# Patient Record
Sex: Female | Born: 1980 | Race: White | Hispanic: Yes | Marital: Single | Smoking: Never smoker
Health system: Southern US, Community
[De-identification: ages and names within clinical notes are randomized; demographics above are authoritative.]

## PROBLEM LIST (undated history)

## (undated) DIAGNOSIS — F32A Depression, unspecified: Secondary | ICD-10-CM

## (undated) DIAGNOSIS — F329 Major depressive disorder, single episode, unspecified: Secondary | ICD-10-CM

## (undated) DIAGNOSIS — F419 Anxiety disorder, unspecified: Secondary | ICD-10-CM

---

## 2003-06-09 ENCOUNTER — Inpatient Hospital Stay (HOSPITAL_COMMUNITY): Admission: RE | Admit: 2003-06-09 | Discharge: 2003-06-12 | Payer: Self-pay | Admitting: Obstetrics

## 2004-11-14 ENCOUNTER — Inpatient Hospital Stay (HOSPITAL_COMMUNITY): Admission: EM | Admit: 2004-11-14 | Discharge: 2004-11-16 | Payer: Self-pay | Admitting: Emergency Medicine

## 2004-11-14 ENCOUNTER — Ambulatory Visit: Payer: Self-pay | Admitting: Internal Medicine

## 2005-07-19 ENCOUNTER — Ambulatory Visit: Payer: Self-pay | Admitting: Obstetrics and Gynecology

## 2005-07-24 ENCOUNTER — Ambulatory Visit (HOSPITAL_COMMUNITY): Admission: RE | Admit: 2005-07-24 | Discharge: 2005-07-24 | Payer: Self-pay | Admitting: *Deleted

## 2005-08-09 ENCOUNTER — Ambulatory Visit: Payer: Self-pay | Admitting: Obstetrics and Gynecology

## 2013-01-17 ENCOUNTER — Encounter (HOSPITAL_COMMUNITY): Payer: Self-pay

## 2013-01-17 ENCOUNTER — Other Ambulatory Visit (HOSPITAL_COMMUNITY): Payer: Self-pay | Admitting: *Deleted

## 2013-01-17 DIAGNOSIS — N6310 Unspecified lump in the right breast, unspecified quadrant: Secondary | ICD-10-CM

## 2013-01-21 ENCOUNTER — Encounter (INDEPENDENT_AMBULATORY_CARE_PROVIDER_SITE_OTHER): Payer: Self-pay

## 2013-01-21 ENCOUNTER — Ambulatory Visit (HOSPITAL_COMMUNITY)
Admission: RE | Admit: 2013-01-21 | Discharge: 2013-01-21 | Disposition: A | Discharge status: Home / Self Care | Payer: No Typology Code available for payment source | Source: Ambulatory Visit | Attending: Obstetrics and Gynecology | Admitting: Obstetrics and Gynecology

## 2013-01-21 ENCOUNTER — Encounter (HOSPITAL_COMMUNITY): Payer: Self-pay

## 2013-01-21 VITALS — BP 116/68 | Temp 98.0°F | Ht 62.0 in | Wt 143.4 lb

## 2013-01-21 DIAGNOSIS — N6315 Unspecified lump in the right breast, overlapping quadrants: Secondary | ICD-10-CM | POA: Insufficient documentation

## 2013-01-21 DIAGNOSIS — Z1239 Encounter for other screening for malignant neoplasm of breast: Secondary | ICD-10-CM

## 2013-01-21 HISTORY — DX: Anxiety disorder, unspecified: F41.9

## 2013-01-21 HISTORY — DX: Major depressive disorder, single episode, unspecified: F32.9

## 2013-01-21 HISTORY — DX: Depression, unspecified: F32.A

## 2013-01-21 NOTE — Patient Instructions (Signed)
Taught Claudia Mccoy how to perform BSE and gave educational materials to take home. Claudia Mccoy did not need a Pap smear today due to last Pap smear was 05/24/2011. Let her know BCCCP will cover Pap smears every 3 years unless has a history of abnormal Pap smears. Referred Claudia Mccoy to the Breast Center of [facility] for diagnostic mammogram and right breast ultrasound. Appointment scheduled for Thursday, February 06, 2013 at 1315. Claudia Mccoy aware of appointment and will be there.  [name] verbalized understanding.  Larisha Vencill, [name], RN 2:56 PM

## 2013-01-21 NOTE — Progress Notes (Signed)
Complaints of left breast lump x 1.5 years that patient states is painful at times with the pain increasing over the the last 5 months. Patient states the pain is a burning like feeling that she rates at a 5 out of 10.  Pap Smear:    Pap smear not completed today. Last Pap smear was 05/24/2011 at the [facility] Department and normal. Per patient no history of an abnormal Pap smear. Last Pap smear result is scanned in EPIC under media.  Physical exam: Breasts Breasts symmetrical. No skin abnormalities bilateral breasts. No nipple retraction bilateral breasts. No nipple discharge bilateral breasts. No lymphadenopathy. No lumps palpated right breast. Palpated a lump within the left breast at 3 o'clock next to the areola. No complaints of pain or tenderness on exam. Referred patient to the [facility] Center of [facility] for diagnostic mammogram and right breast ultrasound. Appointment scheduled for Thursday, February 06, 2013 at 1315.  Pelvic/Bimanual No Pap smear completed today since last Pap smear was 05/24/2011. Pap smear not indicated per BCCCP guidelines.

## 2013-01-23 ENCOUNTER — Other Ambulatory Visit (HOSPITAL_COMMUNITY): Payer: Self-pay | Admitting: *Deleted

## 2013-01-23 DIAGNOSIS — N6320 Unspecified lump in the left breast, unspecified quadrant: Secondary | ICD-10-CM

## 2013-01-30 ENCOUNTER — Ambulatory Visit: Payer: Self-pay

## 2013-02-06 ENCOUNTER — Ambulatory Visit
Admission: RE | Admit: 2013-02-06 | Discharge: 2013-02-06 | Disposition: A | Discharge status: Home / Self Care | Payer: No Typology Code available for payment source | Source: Ambulatory Visit | Attending: Obstetrics and Gynecology | Admitting: Obstetrics and Gynecology

## 2013-02-06 DIAGNOSIS — N6310 Unspecified lump in the right breast, unspecified quadrant: Secondary | ICD-10-CM

## 2013-07-04 ENCOUNTER — Other Ambulatory Visit: Payer: Self-pay | Admitting: Obstetrics and Gynecology

## 2013-07-04 DIAGNOSIS — R921 Mammographic calcification found on diagnostic imaging of breast: Secondary | ICD-10-CM

## 2013-08-07 ENCOUNTER — Ambulatory Visit
Admission: RE | Admit: 2013-08-07 | Discharge: 2013-08-07 | Disposition: A | Discharge status: Home / Self Care | Payer: No Typology Code available for payment source | Source: Ambulatory Visit | Attending: Obstetrics and Gynecology | Admitting: Obstetrics and Gynecology

## 2013-08-07 ENCOUNTER — Encounter (INDEPENDENT_AMBULATORY_CARE_PROVIDER_SITE_OTHER): Payer: Self-pay

## 2013-08-07 DIAGNOSIS — R921 Mammographic calcification found on diagnostic imaging of breast: Secondary | ICD-10-CM

## 2013-08-18 ENCOUNTER — Ambulatory Visit: Payer: Self-pay | Admitting: Internal Medicine

## 2014-01-02 ENCOUNTER — Ambulatory Visit: Payer: No Typology Code available for payment source

## 2014-01-19 ENCOUNTER — Encounter (HOSPITAL_COMMUNITY): Payer: Self-pay

## 2017-02-21 ENCOUNTER — Encounter (HOSPITAL_COMMUNITY): Payer: Self-pay

## 2017-08-01 ENCOUNTER — Encounter (HOSPITAL_COMMUNITY): Payer: Self-pay

## 2018-03-25 ENCOUNTER — Other Ambulatory Visit (HOSPITAL_COMMUNITY): Payer: Self-pay | Admitting: *Deleted

## 2018-03-25 DIAGNOSIS — N6320 Unspecified lump in the left breast, unspecified quadrant: Secondary | ICD-10-CM

## 2018-03-28 ENCOUNTER — Other Ambulatory Visit (HOSPITAL_COMMUNITY): Payer: Self-pay | Admitting: *Deleted

## 2018-03-28 ENCOUNTER — Ambulatory Visit
Admission: RE | Admit: 2018-03-28 | Discharge: 2018-03-28 | Disposition: A | Discharge status: Home / Self Care | Payer: No Typology Code available for payment source | Source: Ambulatory Visit | Attending: Obstetrics and Gynecology | Admitting: Obstetrics and Gynecology

## 2018-03-28 ENCOUNTER — Ambulatory Visit
Admission: RE | Admit: 2018-03-28 | Discharge: 2018-03-28 | Disposition: A | Discharge status: Home / Self Care | Payer: Self-pay | Source: Ambulatory Visit | Attending: Obstetrics and Gynecology | Admitting: Obstetrics and Gynecology

## 2018-03-28 ENCOUNTER — Ambulatory Visit (HOSPITAL_COMMUNITY)
Admission: RE | Admit: 2018-03-28 | Discharge: 2018-03-28 | Disposition: A | Discharge status: Home / Self Care | Payer: Self-pay | Source: Ambulatory Visit | Attending: Obstetrics and Gynecology | Admitting: Obstetrics and Gynecology

## 2018-03-28 ENCOUNTER — Encounter (HOSPITAL_COMMUNITY): Payer: Self-pay

## 2018-03-28 VITALS — BP 118/76 | Wt 151.0 lb

## 2018-03-28 DIAGNOSIS — N63 Unspecified lump in unspecified breast: Secondary | ICD-10-CM | POA: Insufficient documentation

## 2018-03-28 DIAGNOSIS — N6452 Nipple discharge: Secondary | ICD-10-CM

## 2018-03-28 DIAGNOSIS — Z1239 Encounter for other screening for malignant neoplasm of breast: Secondary | ICD-10-CM

## 2018-03-28 DIAGNOSIS — N6311 Unspecified lump in the right breast, upper outer quadrant: Secondary | ICD-10-CM

## 2018-03-28 DIAGNOSIS — N6310 Unspecified lump in the right breast, unspecified quadrant: Secondary | ICD-10-CM

## 2018-03-28 DIAGNOSIS — N644 Mastodynia: Secondary | ICD-10-CM

## 2018-03-28 DIAGNOSIS — N6320 Unspecified lump in the left breast, unspecified quadrant: Secondary | ICD-10-CM

## 2018-03-28 NOTE — Addendum Note (Signed)
Encounter addended by: Lynnell Dike, LPN on: 04/24/9561 8:27 AM  Actions taken: Order list changed

## 2018-03-28 NOTE — Addendum Note (Signed)
Encounter addended by: Priscille Heidelberg, RN on: 03/28/2018 9:27 AM  Actions taken: Clinical Note Signed

## 2018-03-28 NOTE — Patient Instructions (Signed)
Explained breast self awareness with [facility]. Patient did not need a Pap smear today due to last Pap smear was 08/01/2017. Let her know BCCCP will cover Pap smears every 3 years unless has a history of abnormal Pap smears. Referred patient to the Breast Center of [facility] for a diagnostic mammogram and right breast ultrasound. Appointment scheduled for Thursday, March 28, 2018 at 1320. Patient aware of appointment and will be there. Let patient know will follow up with her within the next couple weeks with results of breast discharge by phone. Claudia Mccoy verbalized understanding.  Stpehanie Montroy, [name], RN 8:18 AM

## 2018-03-28 NOTE — Progress Notes (Addendum)
Complaints of a right breast lump, pain, and a clear/yellowish colored discharge since August 2019. Patient states the pain comes and goes. Patient rates the pain at a 7-8 out of 10. Patient stated the discharge is spontaneous at times and sometimes expresses due to it relieves the pain.  Pap Smear: Pap smear not completed today. Last Pap smear was 08/01/2017 at the [facility] Department and normal. Per patient has no history of an abnormal Pap smear. Last Pap smear result is in Epic.  Physical exam: Breasts Breasts symmetrical. No skin abnormalities bilateral breasts. No nipple retraction bilateral breasts. No nipple discharge left breast. Expressed a clear colored nipple discharge from the right breast. Sample of discharge sent to Cytology for evaluation. No lymphadenopathy. No lumps palpated left breast. Palpated a pea sized lump within the right breast at 10 o'clock 6 cm from the nipple. Complaints of pain when palpated lump. Referred patient to the [facility] Center of [facility] for a diagnostic mammogram and right breast ultrasound. Appointment scheduled for Thursday, March 28, 2018 at 1320.        Pelvic/Bimanual No Pap smear completed today since last Pap smear was 08/01/2017. Pap smear not indicated per BCCCP guidelines.   Smoking History: Patient has never smoked.  Patient Navigation: Patient education provided. Access to services provided for patient through [facility] program. Spanish interpreter provided.   Breast and Cervical Cancer Risk Assessment: Patient has a family history of a paternal aunt having breast cancer. Patient has no known genetic mutations or history of radiation treatment to the chest before age 26. Patient has no history of cervical dysplasia, immunocompromised, or DES exposure in-utero.  Risk Assessment    Risk Scores      03/28/2018   Last edited by: Lynnell Dike, LPN   5-year risk: 0.3 %   Lifetime risk: 6.4 %         Used Spanish interpreter  Natale Lay from [place].

## 2018-04-01 ENCOUNTER — Encounter (HOSPITAL_COMMUNITY): Payer: Self-pay | Admitting: *Deleted

## 2019-02-28 ENCOUNTER — Other Ambulatory Visit (HOSPITAL_COMMUNITY): Payer: Self-pay | Admitting: *Deleted

## 2019-02-28 DIAGNOSIS — N644 Mastodynia: Secondary | ICD-10-CM

## 2019-03-27 ENCOUNTER — Ambulatory Visit (HOSPITAL_COMMUNITY): Payer: No Typology Code available for payment source

## 2019-04-01 ENCOUNTER — Encounter (HOSPITAL_COMMUNITY): Payer: Self-pay

## 2019-04-01 ENCOUNTER — Ambulatory Visit: Payer: No Typology Code available for payment source

## 2019-04-01 ENCOUNTER — Other Ambulatory Visit: Payer: Self-pay

## 2019-04-01 ENCOUNTER — Ambulatory Visit (HOSPITAL_COMMUNITY)
Admission: RE | Admit: 2019-04-01 | Discharge: 2019-04-01 | Disposition: A | Discharge status: Home / Self Care | Payer: No Typology Code available for payment source | Source: Ambulatory Visit | Attending: Obstetrics and Gynecology | Admitting: Obstetrics and Gynecology

## 2019-04-01 ENCOUNTER — Ambulatory Visit
Admission: RE | Admit: 2019-04-01 | Discharge: 2019-04-01 | Disposition: A | Discharge status: Home / Self Care | Payer: No Typology Code available for payment source | Source: Ambulatory Visit | Attending: Obstetrics and Gynecology | Admitting: Obstetrics and Gynecology

## 2019-04-01 DIAGNOSIS — N644 Mastodynia: Secondary | ICD-10-CM

## 2019-04-01 DIAGNOSIS — Z1239 Encounter for other screening for malignant neoplasm of breast: Secondary | ICD-10-CM | POA: Insufficient documentation

## 2019-04-01 NOTE — Progress Notes (Signed)
Complaints of right outer breast and nipple pain x one month that comes and goes. Patient rates the pain at a 8 out of 10.  Pap Smear: Pap smear not completed today. Last Pap smear was 08/01/2017 at the [facility] Department and normal. Per patient has no history of an abnormal Pap smear. Last Pap smear result is in Epic.  Physical exam: Breasts Breasts symmetrical. No skin abnormalities bilateral breasts. No nipple retraction bilateral breasts. No nipple discharge bilateral breasts. No lymphadenopathy. No lumps palpated bilateral breasts. Complaints of right outer breast and axillary pain on exam. Referred patient to the [facility] Center of [facility] for a diagnostic mammogram. Appointment scheduled for Tuesday, April 01, 2019 at 0940.        Pelvic/Bimanual No Pap smear completed today since last Pap smear was 08/01/2017. Pap smear not indicated per BCCCP guidelines.   Smoking History: Patient has never smoked.  Patient Navigation: Patient education provided. Access to services provided for patient through [facility] program. Spanish interpreter provided.   Breast and Cervical Cancer Risk Assessment: Patient has a family history of a paternal aunt having breast cancer. Patient has no known genetic mutations or history of radiation treatment to the chest before age 84. Patient has no history of cervical dysplasia, immunocompromised, or DES exposure in-utero.  Risk Assessment    Risk Scores      04/01/2019 03/28/2018   Last edited by: Priscille Heidelberg, RN Lynnell Dike, LPN   5-year risk: 0.3 % 0.3 %   Lifetime risk: 6.4 % 6.4 %         Used Spanish interpreter Natale Lay from [place].

## 2019-04-01 NOTE — Patient Instructions (Signed)
Explained breast self awareness with [facility]. Patient did not need a Pap smear today due to last Pap smear was 08/01/2017. Let her know BCCCP will cover Pap smears every 3 years unless has a history of abnormal Pap smears. Referred patient to the Breast Center of [facility] for a diagnostic mammogram. Appointment scheduled for Tuesday, April 01, 2019 at 0940. Patient aware of appointment and will be there. Claudia Mccoy verbalized understanding.  Ezri Landers, [name], RN 8:29 AM

## 2020-02-27 ENCOUNTER — Other Ambulatory Visit: Payer: Self-pay | Admitting: Obstetrics and Gynecology

## 2020-02-27 DIAGNOSIS — Z1231 Encounter for screening mammogram for malignant neoplasm of breast: Secondary | ICD-10-CM

## 2020-04-06 ENCOUNTER — Ambulatory Visit: Payer: No Typology Code available for payment source

## 2020-04-22 ENCOUNTER — Ambulatory Visit: Payer: Self-pay | Admitting: *Deleted

## 2020-04-22 ENCOUNTER — Ambulatory Visit: Payer: No Typology Code available for payment source

## 2020-04-22 ENCOUNTER — Other Ambulatory Visit: Payer: Self-pay

## 2020-04-22 VITALS — BP 122/74 | Wt 156.8 lb

## 2020-04-22 DIAGNOSIS — Z1239 Encounter for other screening for malignant neoplasm of breast: Secondary | ICD-10-CM

## 2020-04-22 DIAGNOSIS — N644 Mastodynia: Secondary | ICD-10-CM

## 2020-04-22 NOTE — Patient Instructions (Signed)
Explained breast self awareness with [facility]. Patient did not need a Pap smear today due to last Pap smear was 04/16/2020 per patient. Let her know BCCCP will cover Pap smears every 3 years unless has a history of abnormal Pap smears. Referred patient to the Breast Center of [facility] for a diagnostic mammogram. Appointment scheduled Tuesday, April 27, 2020 at 0910. Patient aware of appointment and will be there. Claudia Mccoy verbalized understanding.  [name], [name], RN 3:15 PM

## 2020-04-22 NOTE — Progress Notes (Signed)
Ms. Claudia Mccoy is a 40 y.o. female who presents to [facility] clinic today with complaint of right outer breast and axillary pain x one year after last mammogram. Patient stated the pain comes and goes and lasted 15 days last month. Patient rates the pain at a 8 out of 10.    Pap Smear: Pap smear not completed today. Last Pap smear was 04/16/2020 at the [facility] Department clinic and was normal per patient. Per patient has no history of an abnormal Pap smear. Last Pap smear result is not available in Epic.   Physical exam: Breasts Breasts symmetrical. No skin abnormalities bilateral breasts. No nipple retraction bilateral breasts. No nipple discharge bilateral breasts. No lymphadenopathy. No lumps palpated bilateral breasts. Complaints of bilateral outer breast tenderness that is greater within the right breast.       Pelvic/Bimanual Pap is not indicated today per BCCCP guidelines.   Smoking History: Patient has never smoked.   Patient Navigation: Patient education provided. Access to services provided for patient through [place] program. Spanish interpreter Natale Lay from [facility] provided.   Breast and Cervical Cancer Risk Assessment: Patient has family history of a paternal aunt having breast cancer. Patient has no known genetic mutations or history of radiation treatment to the chest before age 21. Patient does not have history of cervical dysplasia, immunocompromised, or DES exposure in-utero.  Risk Assessment    Risk Scores      04/22/2020 04/01/2019   Last edited by: Narda Rutherford, LPN Chizaram Latino, Carlye Grippe, RN   5-year risk: 0.4 % 0.3 %   Lifetime risk: 6.3 % 6.4 %          A: BCCCP exam without pap smear Complaint of right outer breast and axillary pain.  P: Referred patient to the [facility] Center of [facility] for a diagnostic mammogram. Appointment scheduled Tuesday, April 27, 2020 at 0910.  Priscille Heidelberg, RN 04/22/2020 3:15 PM

## 2020-04-27 ENCOUNTER — Other Ambulatory Visit: Payer: Self-pay

## 2020-04-27 ENCOUNTER — Ambulatory Visit
Admission: RE | Admit: 2020-04-27 | Discharge: 2020-04-27 | Disposition: A | Discharge status: Home / Self Care | Payer: No Typology Code available for payment source | Source: Ambulatory Visit | Attending: Obstetrics and Gynecology | Admitting: Obstetrics and Gynecology

## 2020-04-27 ENCOUNTER — Ambulatory Visit: Payer: No Typology Code available for payment source

## 2020-04-27 DIAGNOSIS — N644 Mastodynia: Secondary | ICD-10-CM

## 2021-03-25 ENCOUNTER — Other Ambulatory Visit: Payer: Self-pay

## 2021-03-25 DIAGNOSIS — Z1231 Encounter for screening mammogram for malignant neoplasm of breast: Secondary | ICD-10-CM

## 2021-05-12 ENCOUNTER — Ambulatory Visit: Payer: Self-pay

## 2021-06-16 ENCOUNTER — Ambulatory Visit: Payer: Self-pay | Admitting: *Deleted

## 2021-06-16 ENCOUNTER — Ambulatory Visit
Admission: RE | Admit: 2021-06-16 | Discharge: 2021-06-16 | Disposition: A | Discharge status: Home / Self Care | Payer: No Typology Code available for payment source | Source: Ambulatory Visit | Attending: Obstetrics and Gynecology | Admitting: Obstetrics and Gynecology

## 2021-06-16 VITALS — BP 120/74 | Wt 162.9 lb

## 2021-06-16 DIAGNOSIS — Z1239 Encounter for other screening for malignant neoplasm of breast: Secondary | ICD-10-CM

## 2021-06-16 DIAGNOSIS — Z1231 Encounter for screening mammogram for malignant neoplasm of breast: Secondary | ICD-10-CM

## 2021-06-16 NOTE — Progress Notes (Signed)
Ms. Claudia Mccoy is a 41 y.o. female who presents to [facility] clinic today with no complaints.  ?  ?Pap Smear: Pap smear not completed today. Last Pap smear was 04/16/2020 at the [facility] Department clinic and was normal per patient. Per patient has no history of an abnormal Pap smear. Last Pap smear result is not available in Epic. ?  ?Physical exam: ?Breasts ?Breasts symmetrical. No skin abnormalities bilateral breasts. No nipple retraction bilateral breasts. No nipple discharge bilateral breasts. No lymphadenopathy. No lumps palpated bilateral breasts. No complaints of pain or tenderness on exam. ? ?MS DIGITAL DIAG TOMO BILAT ? ?Result Date: 04/27/2020 ?CLINICAL DATA:  Nonfocal right breast pain, unchanged since January 2000. EXAM: DIGITAL DIAGNOSTIC BILATERAL MAMMOGRAM WITH TOMOSYNTHESIS AND CAD TECHNIQUE: Bilateral digital diagnostic mammography and breast tomosynthesis was performed. The images were evaluated with computer-aided detection. COMPARISON:  Previous exam(s). ACR Breast Density Category c: The breast tissue is heterogeneously dense, which may obscure small masses. FINDINGS: Bilateral benign calcifications are known. No suspicious masses, calcifications, or distortion identified. IMPRESSION: No mammographic evidence of malignancy. RECOMMENDATION: Treatment of the patient's symptoms should be based on clinical and physical exam given lack of imaging findings. Recommend annual screening mammography. I have discussed the findings and recommendations with the patient. If applicable, a reminder letter will be sent to the patient regarding the next appointment. BI-RADS CATEGORY  2: Benign. Electronically Signed   By: Gerome Sam III M.D   On: 04/27/2020 09:50  ? ?MS DIGITAL DIAG TOMO BILAT ? ?Result Date: 04/01/2019 ?CLINICAL DATA:  41 year old female presenting for evaluation of nonfocal breast pain in the right breast which predominantly involves the central, lower and upper outer  quadrants into her right axilla/shoulder, however it also sometimes involves the medial right breast. This pain has been present for 1 year. EXAM: DIGITAL DIAGNOSTIC BILATERAL MAMMOGRAM WITH CAD AND TOMO COMPARISON:  Previous exam(s). ACR Breast Density Category c: The breast tissue is heterogeneously dense, which may obscure small masses. FINDINGS: No suspicious calcifications, masses or areas of distortion are seen in the bilateral breasts. Mammographic images were processed with CAD. IMPRESSION: 1. There are no suspicious mammographic findings to explain the patient's nonfocal right breast pain. 2.  No mammographic evidence of malignancy in the bilateral breasts. RECOMMENDATION: 1. Clinical follow-up recommended for the right breast pain. Any further workup should be based on clinical grounds. 2.  screening mammogram in one year.(Code:SM-B-01Y) I have discussed the findings and recommendations with the patient. If applicable, a reminder letter will be sent to the patient regarding the next appointment. BI-RADS CATEGORY  1: Negative. Electronically Signed   By: Frederico Hamman M.D.   On: 04/01/2019 10:05  ? ?MS DIGITAL DIAG TOMO BILAT ? ?Result Date: 03/28/2018 ?CLINICAL DATA:  41 year old female with a palpable abnormality in the right breast. EXAM: DIGITAL DIAGNOSTIC BILATERAL MAMMOGRAM WITH CAD AND TOMO RIGHT BREAST ULTRASOUND COMPARISON:  Previous exam(s). ACR Breast Density Category c: The breast tissue is heterogeneously dense, which may obscure small masses. FINDINGS: No suspicious masses or calcifications are seen in either breast. Spot compression CC tomograms were performed over the palpable area of concern in the right breast with no definite abnormality seen. Mammographic images were processed with CAD. Physical examination at site of palpable concern in the upper-outer right breast reveals generalized thickening without a discrete underlying palpable mass. The patient states this area tends to remain  tender. Targeted ultrasound of the right breast was performed. No suspicious masses or abnormality seen, only  heterogeneous fibroglandular tissue identified. IMPRESSION: 1.  No findings of malignancy in either breast. 2. No abnormalities identified at site of palpable concern in the upper-outer right breast. RECOMMENDATION: 1. Recommend further management of the right breast palpable abnormality be based on clinical assessment. 2.  Screening mammogram in one year.(Code:SM-B-01Y) I have discussed the findings and recommendations with the patient. Results were also provided in writing at the conclusion of the visit. If applicable, a reminder letter will be sent to the patient regarding the next appointment. BI-RADS CATEGORY  1: Negative. Electronically Signed   By: Edwin Cap M.D.   On: 03/28/2018 14:04   ?     ?Pelvic/Bimanual ?Pap is not indicated today per BCCCP guidelines. ?  ?Smoking History: ?Patient has never smoked. ? ?Patient Navigation: ?Patient education provided. Access to services provided for patient through [place] program. Spanish interpreter Natale Lay from [facility] provided.  ?  ?Breast and Cervical Cancer Risk Assessment: ?Patient has family history of a paternal aunt having breast cancer. Patient has no known genetic mutations or history of radiation treatment to the chest before age 89. Patient does not have history of cervical dysplasia, immunocompromised, or DES exposure in-utero. ? ?Risk Assessment   ? ? Risk Scores   ? ?   06/16/2021 04/22/2020  ? Last edited by: Narda Rutherford, LPN [place], Fidel Levy, LPN  ? 5-year risk: 0.4 % 0.4 %  ? Lifetime risk: 6.3 % 6.3 %  ? ?  ?  ? ?  ? ? ?A: ?BCCCP exam without pap smear ?No complaints. ? ?P: ?Referred patient to the Breast Center of [facility] for a screening mammogram. Appointment scheduled Thursday, June 16, 2021 at 1430. ? ?Priscille Heidelberg, RN ?06/16/2021 1:13 PM   ?

## 2021-06-16 NOTE — Patient Instructions (Signed)
Explained breast self awareness with [facility]. Patient did not need a Pap smear today due to last Pap smear was 04/16/2020. Let her know BCCCP will cover Pap smears every 3 years unless has a history of abnormal Pap smears. Referred patient to the [place] for a screening mammogram. Appointment scheduled Thursday, June 16, 2021 at 1430. Patient aware of appointment and will be there. Let patient know the Breast Center will follow up with her within the next couple weeks with results of her mammogram by letter or phone. Claudia Mccoy verbalized understanding. ? ?[name], Arvil Chaco, RN ?1:13 PM ? ? ? ? ?

## 2021-10-15 IMAGING — MG DIGITAL DIAGNOSTIC BILAT W/ TOMO W/ CAD
8 series · 9 of 24 positions shown · non-contrast
Comparison: Previous exam(s).

CLINICAL DATA: Nonfocal right breast pain, unchanged since March 1998.

EXAM:
DIGITAL DIAGNOSTIC BILATERAL MAMMOGRAM WITH TOMOSYNTHESIS AND CAD
TECHNIQUE: Bilateral digital diagnostic mammography and breast tomosynthesis
was performed. The images were evaluated with computer-aided
detection.

[L CC synth-2D]
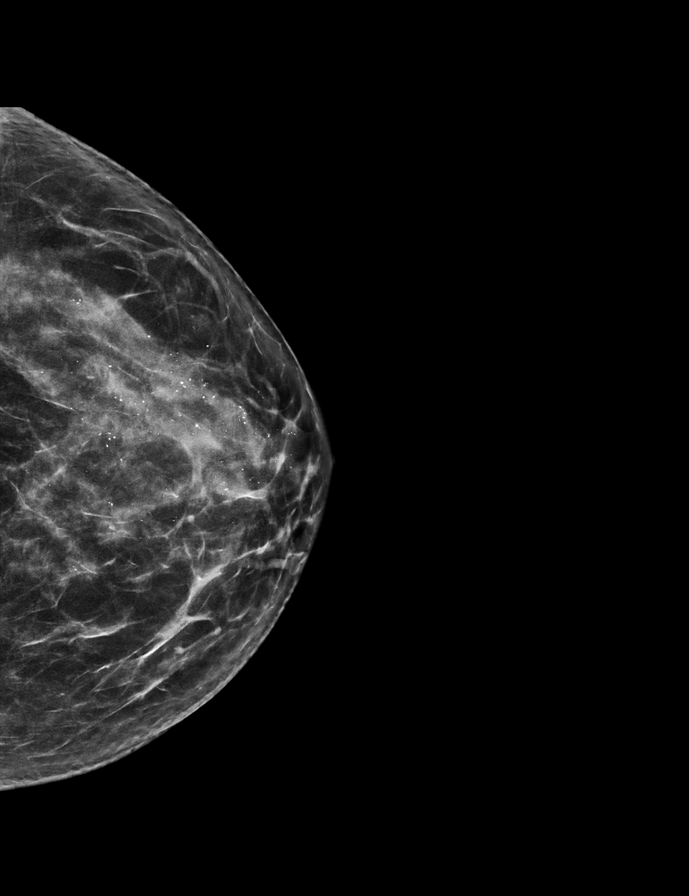

[R CC synth-2D]
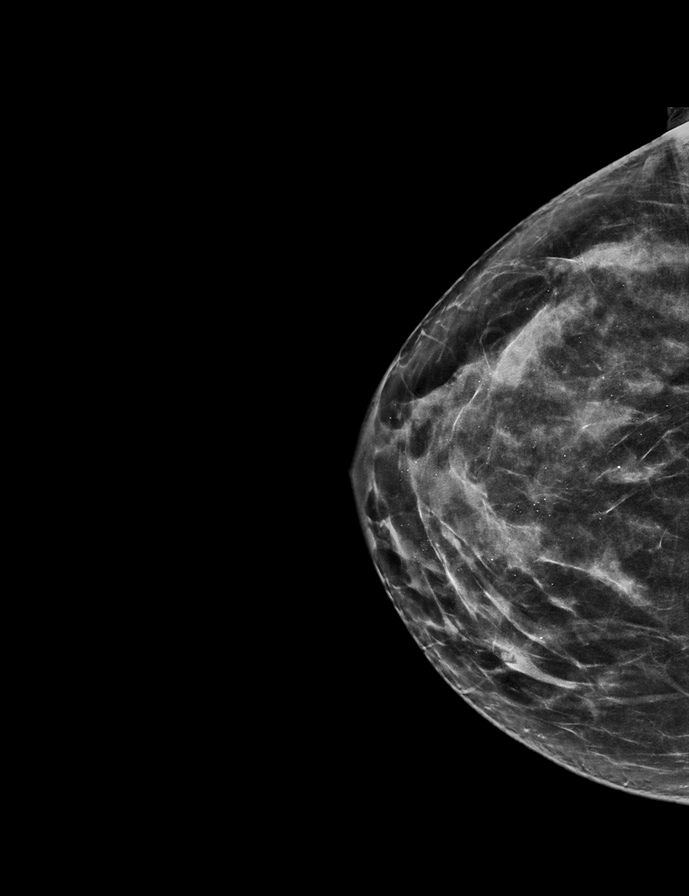

[R MLO synth-2D]
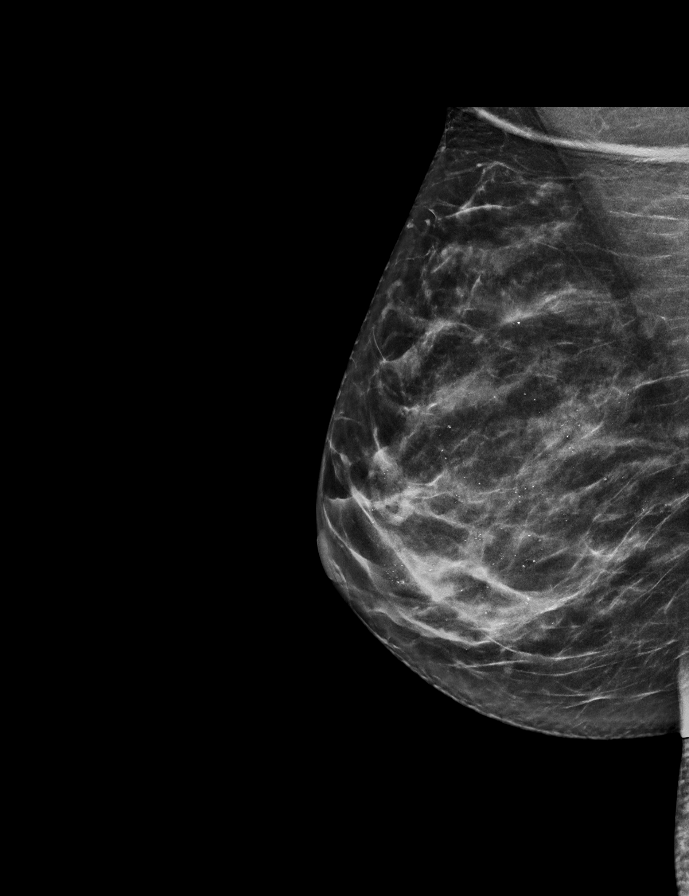

[L MLO synth-2D]
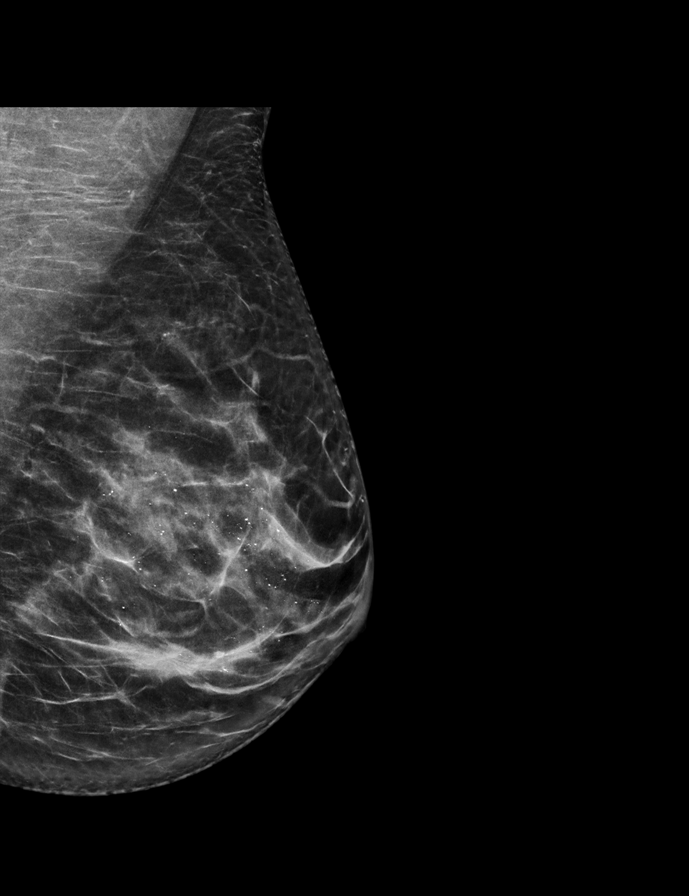

[L MLO tomo · 2 of 69 frames shown]
[frame 23/69]
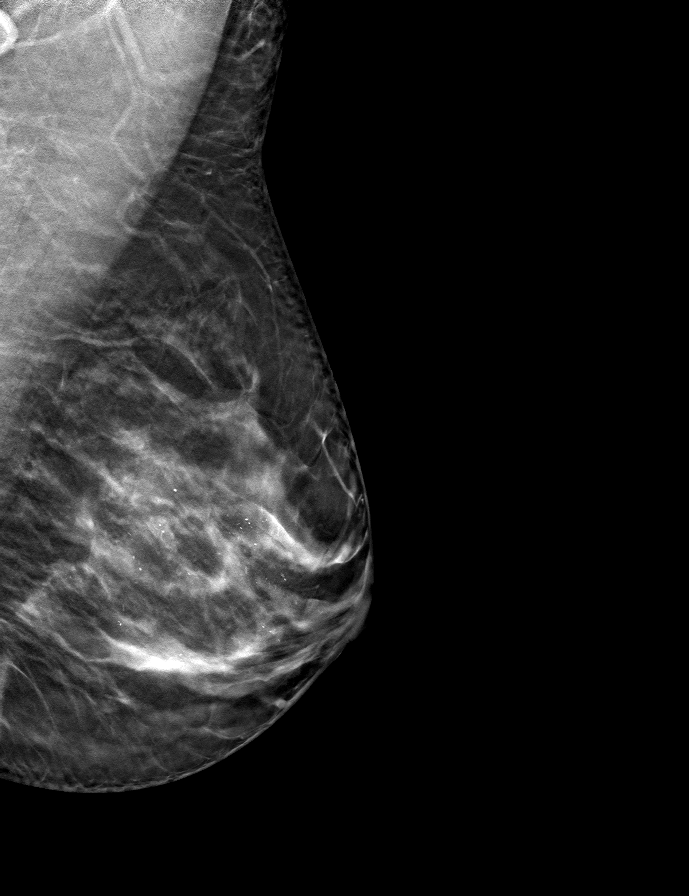
[frame 35/69]
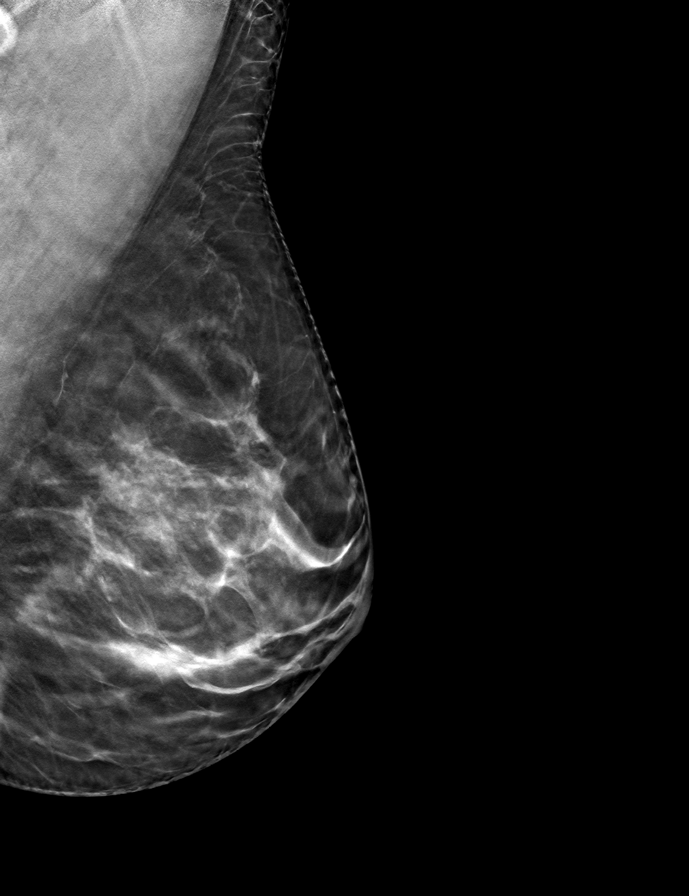

[R CC tomo · tomo slice 34/67.0]
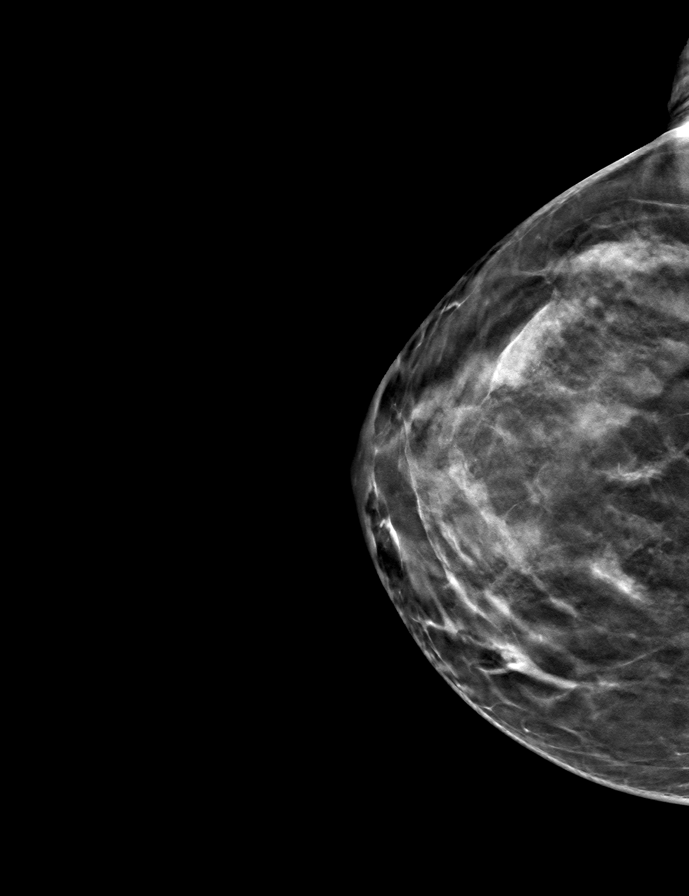

[L CC tomo · tomo slice 32/63.0]
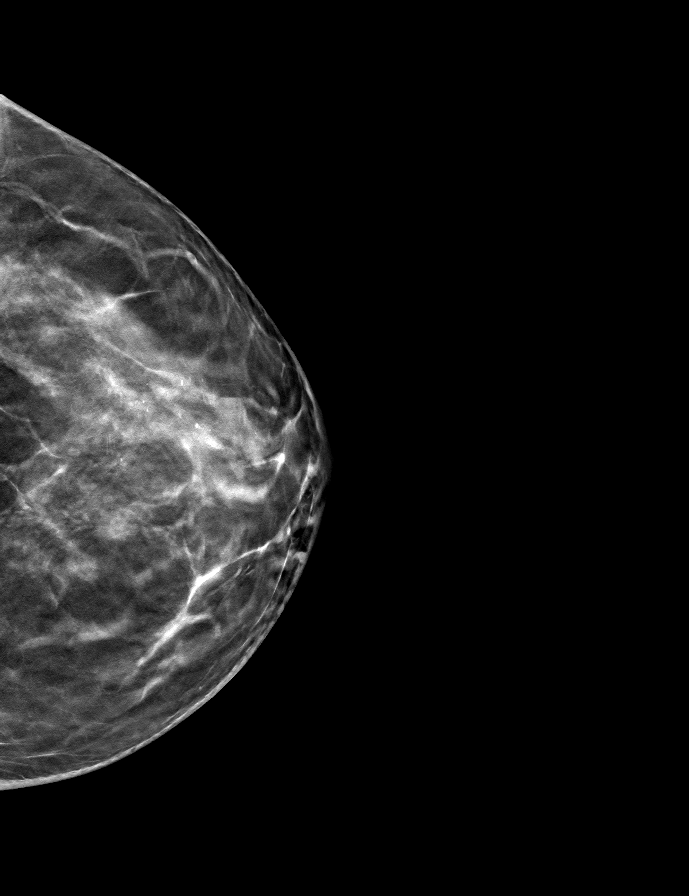

[R MLO tomo · tomo slice 34/67.0]
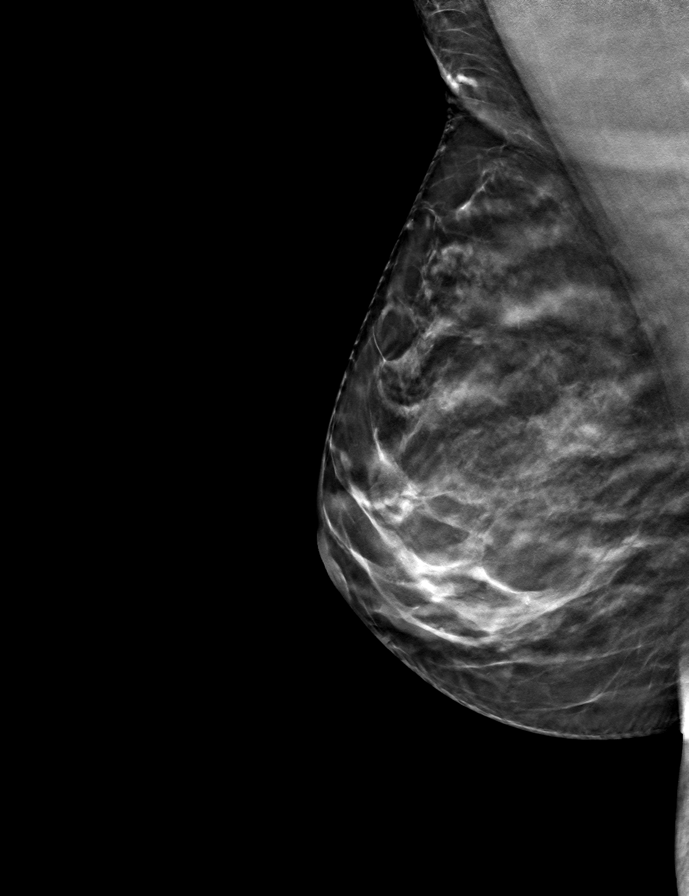

[9 of 24 positions shown; findings below may reference images not displayed]

ACR Breast Density Category c: The breast tissue is heterogeneously
dense, which may obscure small masses.
FINDINGS: Bilateral benign calcifications are known. No suspicious masses,
calcifications, or distortion identified.
IMPRESSION: No mammographic evidence of malignancy.

RECOMMENDATION:
Treatment of the patient's symptoms should be based on clinical and
physical exam given lack of imaging findings. Recommend annual
screening mammography.

I have discussed the findings and recommendations with the patient.
If applicable, a reminder letter will be sent to the patient
regarding the next appointment.

BI-RADS CATEGORY  2: Benign.

## 2022-04-27 ENCOUNTER — Other Ambulatory Visit: Payer: Self-pay | Admitting: Obstetrics and Gynecology

## 2022-04-27 DIAGNOSIS — Z1231 Encounter for screening mammogram for malignant neoplasm of breast: Secondary | ICD-10-CM

## 2022-06-07 ENCOUNTER — Ambulatory Visit: Payer: Self-pay | Admitting: *Deleted

## 2022-06-07 NOTE — Telephone Encounter (Signed)
  Chief Complaint: Shoulder pain Symptoms: Left shoulder "Spasms" radiates down arm and across to right side." Occurs with movement. "Cramping" States no pain presently Frequency: 3 days Pertinent Negatives: Patient denies injury Disposition: [] ED /[] Urgent Care (no appt availability in office) / [] Appointment(In office/virtual)/ []  [place] [facility]/ [] Home Care/ [] Refused Recommended Disposition /[x] [place] Mobile [facility]/ []  Follow-up with PCP Additional Notes: No PCP, advised [facility]. States will follow disposition.  Care advise provided, pt verbalizes understanding. Reason for Disposition  [1] MODERATE pain (e.g., interferes with normal activities) AND [2] present > 3 days  Answer Assessment - Initial Assessment Questions 1. ONSET: "When did the pain start?"     3 days ago 2. LOCATION: "Where is the pain located?"     Left shoulder 3. PAIN: "How bad is the pain?" (Scale 1-10; or mild, moderate, severe)   - MILD (1-3): doesn't interfere with normal activities   - MODERATE (4-7): interferes with normal activities (e.g., work or school) or awakens from sleep   - SEVERE (8-10): excruciating pain, unable to do any normal activities, unable to move arm at all due to pain     None presently 4. WORK OR EXERCISE: "Has there been any recent work or exercise that involved this part of the body?"     no 5. CAUSE: "What do you think is causing the shoulder pain?"     Unsure 6. OTHER SYMPTOMS: "Do you have any other symptoms?" (e.g., neck pain, swelling, rash, fever, numbness, weakness)     Mild headache, shoulder spasms worse with movement, radiates down arm  Protocols used: Shoulder Pain-A-AH

## 2022-06-20 ENCOUNTER — Ambulatory Visit
Admission: RE | Admit: 2022-06-20 | Discharge: 2022-06-20 | Disposition: A | Discharge status: Home / Self Care | Payer: Self-pay | Source: Ambulatory Visit | Attending: Obstetrics and Gynecology | Admitting: Obstetrics and Gynecology

## 2022-06-20 ENCOUNTER — Ambulatory Visit: Payer: Self-pay | Admitting: Hematology and Oncology

## 2022-06-20 VITALS — BP 119/83 | Wt 159.0 lb

## 2022-06-20 DIAGNOSIS — Z1231 Encounter for screening mammogram for malignant neoplasm of breast: Secondary | ICD-10-CM

## 2022-06-20 NOTE — Progress Notes (Signed)
Ms. Claudia Mccoy is a 42 y.o. female who presents to [facility] clinic today with no complaints.    Pap Smear: Pap not smear completed today. Last Pap smear was 2022 and was normal. Per patient has no history of an abnormal Pap smear. Last Pap smear result is available in Epic.   Physical exam: Breasts Breasts symmetrical. No skin abnormalities bilateral breasts. No nipple retraction bilateral breasts. No nipple discharge bilateral breasts. No lymphadenopathy. No lumps palpated bilateral breasts.  MS DIGITAL SCREENING TOMO BILATERAL  Result Date: 06/17/2021 CLINICAL DATA:  Screening. EXAM: DIGITAL SCREENING BILATERAL MAMMOGRAM WITH TOMOSYNTHESIS AND CAD TECHNIQUE: Bilateral screening digital craniocaudal and mediolateral oblique mammograms were obtained. Bilateral screening digital breast tomosynthesis was performed. The images were evaluated with computer-aided detection. COMPARISON:  Previous exam(s). ACR Breast Density Category c: The breast tissue is heterogeneously dense, which may obscure small masses. FINDINGS: There are no findings suspicious for malignancy. IMPRESSION: No mammographic evidence of malignancy. A result letter of this screening mammogram will be mailed directly to the patient. RECOMMENDATION: Screening mammogram in one year. (Code:SM-B-01Y) BI-RADS CATEGORY  1: Negative. Electronically Signed   By: Ammie Ferrier M.D.   On: 06/17/2021 15:40   MS DIGITAL DIAG TOMO BILAT  Result Date: 04/27/2020 CLINICAL DATA:  Nonfocal right breast pain, unchanged since January 2000. EXAM: DIGITAL DIAGNOSTIC BILATERAL MAMMOGRAM WITH TOMOSYNTHESIS AND CAD TECHNIQUE: Bilateral digital diagnostic mammography and breast tomosynthesis was performed. The images were evaluated with computer-aided detection. COMPARISON:  Previous exam(s). ACR Breast Density Category c: The breast tissue is heterogeneously dense, which may obscure small masses. FINDINGS: Bilateral benign calcifications are known. No  suspicious masses, calcifications, or distortion identified. IMPRESSION: No mammographic evidence of malignancy. RECOMMENDATION: Treatment of the patient's symptoms should be based on clinical and physical exam given lack of imaging findings. Recommend annual screening mammography. I have discussed the findings and recommendations with the patient. If applicable, a reminder letter will be sent to the patient regarding the next appointment. BI-RADS CATEGORY  2: Benign. Electronically Signed   By: Dorise Bullion III M.D   On: 04/27/2020 09:50   MS DIGITAL DIAG TOMO BILAT  Result Date: 04/01/2019 CLINICAL DATA:  42 year old female presenting for evaluation of nonfocal breast pain in the right breast which predominantly involves the central, lower and upper outer quadrants into her right axilla/shoulder, however it also sometimes involves the medial right breast. This pain has been present for 1 year. EXAM: DIGITAL DIAGNOSTIC BILATERAL MAMMOGRAM WITH CAD AND TOMO COMPARISON:  Previous exam(s). ACR Breast Density Category c: The breast tissue is heterogeneously dense, which may obscure small masses. FINDINGS: No suspicious calcifications, masses or areas of distortion are seen in the bilateral breasts. Mammographic images were processed with CAD. IMPRESSION: 1. There are no suspicious mammographic findings to explain the patient's nonfocal right breast pain. 2.  No mammographic evidence of malignancy in the bilateral breasts. RECOMMENDATION: 1. Clinical follow-up recommended for the right breast pain. Any further workup should be based on clinical grounds. 2.  screening mammogram in one year.(Code:SM-B-01Y) I have discussed the findings and recommendations with the patient. If applicable, a reminder letter will be sent to the patient regarding the next appointment. BI-RADS CATEGORY  1: Negative. Electronically Signed   By: Ammie Ferrier M.D.   On: 04/01/2019 10:05   MS DIGITAL DIAG TOMO BILAT  Result Date:  03/28/2018 CLINICAL DATA:  41 year old female with a palpable abnormality in the right breast. EXAM: DIGITAL DIAGNOSTIC BILATERAL MAMMOGRAM WITH CAD AND TOMO RIGHT  BREAST ULTRASOUND COMPARISON:  Previous exam(s). ACR Breast Density Category c: The breast tissue is heterogeneously dense, which may obscure small masses. FINDINGS: No suspicious masses or calcifications are seen in either breast. Spot compression CC tomograms were performed over the palpable area of concern in the right breast with no definite abnormality seen. Mammographic images were processed with CAD. Physical examination at site of palpable concern in the upper-outer right breast reveals generalized thickening without a discrete underlying palpable mass. The patient states this area tends to remain tender. Targeted ultrasound of the right breast was performed. No suspicious masses or abnormality seen, only heterogeneous fibroglandular tissue identified. IMPRESSION: 1.  No findings of malignancy in either breast. 2. No abnormalities identified at site of palpable concern in the upper-outer right breast. RECOMMENDATION: 1. Recommend further management of the right breast palpable abnormality be based on clinical assessment. 2.  Screening mammogram in one year.(Code:SM-B-01Y) I have discussed the findings and recommendations with the patient. Results were also provided in writing at the conclusion of the visit. If applicable, a reminder letter will be sent to the patient regarding the next appointment. BI-RADS CATEGORY  1: Negative. Electronically Signed   By: Everlean Alstrom M.D.   On: 03/28/2018 14:04         Pelvic/Bimanual Pap is not indicated today    Smoking History: Patient has never smoked and was not referred to quit line.    Patient Navigation: Patient education provided. Access to services provided for patient through [place] interpreter provided. No transportation provided   Colorectal Cancer  Screening: Per patient has never had colonoscopy completed No complaints today.    Breast and Cervical Cancer Risk Assessment: Patient does not have family history of breast cancer, known genetic mutations, or radiation treatment to the chest before age 48. Patient does not have history of cervical dysplasia, immunocompromised, or DES exposure in-utero.  Risk Assessment   No risk assessment data for the current encounter  Risk Scores       06/16/2021   Last edited by: Demetrius Revel, LPN   5-year risk: 0.4 %   Lifetime risk: 6.3 %            A: B[facility]CP exam without pap smear No complaints with benign exam.  P: Referred patient to the [facility] for a screening mammogram. Appointment scheduled 06/20/22.  Dayton Scrape A, NP 06/20/2022 9:08 AM

## 2022-06-20 NOTE — Patient Instructions (Signed)
Taught Claudia Mccoy about self breast awareness and gave educational materials to take home. Patient did not need a Pap smear today due to last Pap smear was in 2022 per patient. Let her know [facility] will cover Pap smears every 5 years unless has a history of abnormal Pap smears. Referred patient to the [facility] for diagnostic mammogram. Appointment scheduled for 06/20/22. Patient aware of appointment and will be there. Let patient know will follow up with her within the next couple weeks with results. Claudia Mccoy verbalized understanding.  Melodye Ped, NP 9:11 AM

## 2022-11-15 ENCOUNTER — Ambulatory Visit: Payer: Self-pay | Admitting: Internal Medicine

## 2023-05-22 ENCOUNTER — Other Ambulatory Visit: Payer: Self-pay | Admitting: Obstetrics and Gynecology

## 2023-05-22 DIAGNOSIS — Z1231 Encounter for screening mammogram for malignant neoplasm of breast: Secondary | ICD-10-CM

## 2023-07-19 ENCOUNTER — Ambulatory Visit: Payer: Self-pay

## 2023-10-11 ENCOUNTER — Ambulatory Visit
Admission: RE | Admit: 2023-10-11 | Discharge: 2023-10-11 | Disposition: A | Discharge status: Home / Self Care | Payer: Self-pay | Source: Ambulatory Visit | Attending: Obstetrics and Gynecology | Admitting: Obstetrics and Gynecology

## 2023-10-11 ENCOUNTER — Ambulatory Visit: Payer: Self-pay | Admitting: *Deleted

## 2023-10-11 VITALS — BP 114/92 | Ht 62.0 in | Wt 161.0 lb

## 2023-10-11 DIAGNOSIS — Z1211 Encounter for screening for malignant neoplasm of colon: Secondary | ICD-10-CM

## 2023-10-11 DIAGNOSIS — Z01419 Encounter for gynecological examination (general) (routine) without abnormal findings: Secondary | ICD-10-CM

## 2023-10-11 DIAGNOSIS — Z1231 Encounter for screening mammogram for malignant neoplasm of breast: Secondary | ICD-10-CM

## 2023-10-11 NOTE — Progress Notes (Signed)
 Ms. Claudia Mccoy is a 43 y.o. [id number] female who presents to [facility] clinic today with complaint of right nipple itching.    Pap Smear: Pap smear completed today. Last Pap smear was 04/16/2020 at the [facility] clinic and was normal per patient. Per patient has no history of an abnormal Pap smear. Last Pap smear result is not available in Epic.    Physical exam: Breasts Breasts symmetrical. No skin abnormalities bilateral breasts. No nipple retraction bilateral breasts. No nipple discharge bilateral breasts. No lymphadenopathy. No lumps palpated bilateral breasts. No complaints of pain or tenderness on exam.  MS DIGITAL SCREENING TOMO BILATERAL Result Date: 06/21/2022 CLINICAL DATA:  Screening. EXAM: DIGITAL SCREENING BILATERAL MAMMOGRAM WITH TOMOSYNTHESIS AND CAD TECHNIQUE: Bilateral screening digital craniocaudal and mediolateral oblique mammograms were obtained. Bilateral screening digital breast tomosynthesis was performed. The images were evaluated with computer-aided detection. COMPARISON:  Previous exam(s). ACR Breast Density Category c: The breasts are heterogeneously dense, which may obscure small masses. FINDINGS: There are no findings suspicious for malignancy. IMPRESSION: No mammographic evidence of malignancy. A result letter of this screening mammogram will be mailed directly to the patient. RECOMMENDATION: Screening mammogram in one year. (Code:SM-B-01Y) BI-RADS CATEGORY  1: Negative. Electronically Signed   By: Reyes Phi M.D.   On: 06/21/2022 16:52   MS DIGITAL SCREENING TOMO BILATERAL Result Date: 06/17/2021 CLINICAL DATA:  Screening. EXAM: DIGITAL SCREENING BILATERAL MAMMOGRAM WITH TOMOSYNTHESIS AND CAD TECHNIQUE: Bilateral screening digital craniocaudal and mediolateral oblique mammograms were obtained. Bilateral screening digital breast tomosynthesis was performed. The images were evaluated with computer-aided detection. COMPARISON:  Previous exam(s). ACR  Breast Density Category c: The breast tissue is heterogeneously dense, which may obscure small masses. FINDINGS: There are no findings suspicious for malignancy. IMPRESSION: No mammographic evidence of malignancy. A result letter of this screening mammogram will be mailed directly to the patient. RECOMMENDATION: Screening mammogram in one year. (Code:SM-B-01Y) BI-RADS CATEGORY  1: Negative. Electronically Signed   By: Rosaline Collet M.D.   On: 06/17/2021 15:40   MS DIGITAL DIAG TOMO BILAT Result Date: 04/27/2020 CLINICAL DATA:  Nonfocal right breast pain, unchanged since January 2000. EXAM: DIGITAL DIAGNOSTIC BILATERAL MAMMOGRAM WITH TOMOSYNTHESIS AND CAD TECHNIQUE: Bilateral digital diagnostic mammography and breast tomosynthesis was performed. The images were evaluated with computer-aided detection. COMPARISON:  Previous exam(s). ACR Breast Density Category c: The breast tissue is heterogeneously dense, which may obscure small masses. FINDINGS: Bilateral benign calcifications are known. No suspicious masses, calcifications, or distortion identified. IMPRESSION: No mammographic evidence of malignancy. RECOMMENDATION: Treatment of the patient's symptoms should be based on clinical and physical exam given lack of imaging findings. Recommend annual screening mammography. I have discussed the findings and recommendations with the patient. If applicable, a reminder letter will be sent to the patient regarding the next appointment. BI-RADS CATEGORY  2: Benign. Electronically Signed   By: Alm Pouch III M.D   On: 04/27/2020 09:50   MS DIGITAL DIAG TOMO BILAT Result Date: 04/01/2019 CLINICAL DATA:  43 year old female presenting for evaluation of nonfocal breast pain in the right breast which predominantly involves the central, lower and upper outer quadrants into her right axilla/shoulder, however it also sometimes involves the medial right breast. This pain has been present for 1 year. EXAM: DIGITAL  DIAGNOSTIC BILATERAL MAMMOGRAM WITH CAD AND TOMO COMPARISON:  Previous exam(s). ACR Breast Density Category c: The breast tissue is heterogeneously dense, which may obscure small masses. FINDINGS: No suspicious calcifications, masses or areas of distortion are seen in  the bilateral breasts. Mammographic images were processed with CAD. IMPRESSION: 1. There are no suspicious mammographic findings to explain the patient's nonfocal right breast pain. 2.  No mammographic evidence of malignancy in the bilateral breasts. RECOMMENDATION: 1. Clinical follow-up recommended for the right breast pain. Any further workup should be based on clinical grounds. 2.  screening mammogram in one year.(Code:SM-B-01Y) I have discussed the findings and recommendations with the patient. If applicable, a reminder letter will be sent to the patient regarding the next appointment. BI-RADS CATEGORY  1: Negative. Electronically Signed   By: Rosaline Collet M.D.   On: 04/01/2019 10:05    Pelvic/Bimanual Ext Genitalia No lesions, no swelling and no discharge observed on external genitalia.        Vagina Vagina pink and normal texture. No lesions or discharge observed in vagina.        Cervix Cervix is present. Cervix pink and of normal texture. No discharge observed. IUD strings visualized.   Uterus Uterus is present and palpable. Uterus in normal position and normal size.        Adnexae Bilateral ovaries present and palpable. No tenderness on palpation.         Rectovaginal No rectal exam completed today since patient had no rectal complaints. No skin abnormalities observed on exam.     Smoking History: Patient has never smoked.   Patient Navigation: Patient education provided. Access to services provided for patient through [place] program. Spanish interpreter Bernice Angry from [facility] provided.    Breast and Cervical Cancer Risk Assessment: Patient has family history of a paternal aunt having breast cancer. Patient  has no known genetic mutations or history of radiation treatment to the chest before age 52. Patient does not have history of cervical dysplasia, immunocompromised, or DES exposure in-utero.   Risk Scores as of Encounter on 10/11/2023     [place]           5-year 0.98%   Lifetime 9.62%   This patient is [place] but has no documented birth country, so the [place] model used data from [place] patients to calculate their risk score. Document a birth country in the Demographics activity for a more accurate score.         Last calculated by Silas, Ansyi K, CMA on 10/11/2023 at  3:09 PM        A: [facility] exam with pap smear No complaints.  P: Referred patient to the [facility] for a screening mammogram on mobile unit. Appointment scheduled Thursday, October 11, 2023 at 1600.  Driscilla Wanda SQUIBB, RN 10/11/2023 3:32 PM

## 2023-10-11 NOTE — Patient Instructions (Signed)
 Explained breast self awareness with [facility]. Pap smear completed today. Let her know [facility] will cover Pap smears and HPV typing every 5 years unless has a history of abnormal Pap smears. Referred patient to the [facility] for a screening mammogram on mobile unit. Appointment scheduled Thursday, October 11, 2023 at 1600. Patient aware of appointment and will be there. Let patient know will follow up with her within the next couple weeks with results of Pap smear by letter or phone. Informed patient that the Breast Center will follow up with her within the next couple of weeks with results of her mammogram by letter or phone. Claudia Mccoy verbalized understanding.  Kiaan Overholser, Wanda Ship, RN 3:32 PM

## 2023-10-17 LAB — CYTOLOGY - PAP
Comment: NEGATIVE
Diagnosis: NEGATIVE
High risk HPV: NEGATIVE

## 2023-10-18 ENCOUNTER — Ambulatory Visit: Payer: Self-pay

## 2024-03-07 ENCOUNTER — Ambulatory Visit
Admission: RE | Admit: 2024-03-07 | Discharge: 2024-03-07 | Disposition: A | Discharge status: Home / Self Care | Source: Ambulatory Visit | Attending: Student | Admitting: Student

## 2024-03-07 VITALS — BP 122/82 | HR 82 | Temp 98.0°F | Resp 16

## 2024-03-07 DIAGNOSIS — R6889 Other general symptoms and signs: Secondary | ICD-10-CM

## 2024-03-07 MED ORDER — PROMETHAZINE-DM 6.25-15 MG/5ML PO SYRP: 5.0000 mL | ORAL_SOLUTION | Freq: Four times a day (QID) | ORAL | 0 refills | Status: AC | PRN

## 2024-03-07 MED ORDER — OSELTAMIVIR PHOSPHATE 75 MG PO CAPS: 75.0000 mg | ORAL_CAPSULE | Freq: Two times a day (BID) | ORAL | 0 refills | Status: AC

## 2024-03-07 NOTE — Discharge Instructions (Signed)
-  You have influenza (the flu) -We are treating this with a medication called Tamiflu , which is an antiviral.  This will help reduce the severity and duration of the fluid.  -Promethazine  DM cough syrup for congestion/cough. This could make you drowsy, so take at night before bed. -Continue drinking Gatorade and water! -Use tylenol and/or ibuprofen for fever reduction or bodyaches, if needed.  Follow the instructions on the bottle for appropriate dosage.  You can take both Tylenol and ibuprofen; you can take them at the same time, or alternate every few hours (for example, Tylenol, then 4 hours later ibuprofen, then 4 hours later Tylenol, etc.) -Viruses like the flu typically last 5 to 7 days.  After 7 days, your symptoms should be improving rather than worsening.  If your symptoms improve, and then worsen again, this is when we worry about a sinus infection or a lung infection, and you should return for additional care.  If at any point you develop fevers that do not reduce with Tylenol, shortness of breath, a cough productive of dark or red sputum, or other symptoms that concern you, seek additional care.

## 2024-03-07 NOTE — ED Provider Notes (Signed)
 " [facility]    CSN: [id number] Arrival date & time: 03/07/24  1447      History   Chief Complaint Chief Complaint  Patient presents with   Cough   Fever    HPI Claudia Mccoy is a 43 y.o. female presenting for viral syndrome since 03/03/24 (got worse 03/04/24). Following exposure to influenza.  Pt c/o fever, cough, congestion, chills, headache, and body aches since Monday. Endorse tactile fevers.  Medications attempted: OTC tea, Tylenol. Last dose of Tylenol was 3 hours ago.  Denies h/o pulm ds.   Here today with daughter, who helps provide translation.   HPI  Past Medical History:  Diagnosis Date   Anxiety    Depression     Patient Active Problem List   Diagnosis Date Noted   Screening breast examination 04/01/2019   Breast pain, right 04/01/2019   Breast lump on right side at 3 o'clock position 01/21/2013    Past Surgical History:  Procedure Laterality Date   CESAREAN SECTION     2 previous     OB History     Gravida  2   Para  2   Term  2   Preterm      AB      Living  2      SAB      IAB      Ectopic      Multiple      Live Births               Home Medications    Prior to Admission medications  Medication Sig Start Date End Date Taking? Authorizing Provider  oseltamivir (TAMIFLU) 75 MG capsule Take 1 capsule (75 mg total) by mouth every 12 (twelve) hours. 03/07/24  Yes Krishauna Schatzman E, PA-C  promethazine-dextromethorphan (PROMETHAZINE-DM) 6.25-15 MG/5ML syrup Take 5 mLs by mouth 4 (four) times daily as needed for cough. 03/07/24  Yes Lesean Woolverton E, PA-C  Ascorbic Acid (VITAMIN C ER PO) Vitamin C  500mg  i tab po qd    [provider]  CALCIUM PO Take by mouth.    [provider]  FERROUS SULFATE PO Take by mouth.    [provider]  levonorgestrel (MIRENA, 52 MG,) 20 MCG/DAY IUD intrauterine device (IUD)    [provider]  Multiple Vitamin (MULTIVITAMIN) tablet Take  1 tablet by mouth daily.    [provider]  Omega-3 Fatty Acids (FISH OIL) 300 MG CAPS Fish Oil  i tab po qd    [provider]  VITAMIN A PO Take by mouth.    [provider]  VITAMIN D, CHOLECALCIFEROL, PO Vitamin D  1000IU i tab po qd    [provider]    Family History Family History  Problem Relation Age of Onset   Heart disease Mother    Asthma Sister    Breast cancer Paternal Aunt 46 - 63    Social History Social History[1]   Allergies   Patient has no known allergies.   Review of Systems Review of Systems  Constitutional:  Positive for chills and fatigue. Negative for appetite change and fever.  HENT:  Positive for congestion. Negative for ear pain, rhinorrhea, sinus pressure, sinus pain and sore throat.   Eyes:  Negative for redness and visual disturbance.  Respiratory:  Positive for cough. Negative for chest tightness, shortness of breath and wheezing.   Cardiovascular:  Negative for chest pain and palpitations.  Gastrointestinal:  Negative  for abdominal pain, constipation, diarrhea, nausea and vomiting.  Genitourinary:  Negative for dysuria, frequency and urgency.  Musculoskeletal:  Positive for myalgias.  Neurological:  Negative for dizziness, weakness and headaches.  Psychiatric/Behavioral:  Negative for confusion.   All other systems reviewed and are negative.    Physical Exam Triage Vital Signs ED Triage Vitals [03/07/24 1522]  Encounter Vitals Group     BP 122/82     Girls Systolic BP Percentile      Girls Diastolic BP Percentile      Boys Systolic BP Percentile      Boys Diastolic BP Percentile      Pulse Rate 82     Resp 16     Temp 98 F (36.7 C)     Temp Source Oral     SpO2 97 %     Weight      Height      Head Circumference      Peak Flow      Pain Score 5     Pain Loc      Pain Education      Exclude from Growth Chart    No data found.  Updated Vital Signs BP 122/82 (BP Location: Right Arm)    Pulse 82   Temp 98 F (36.7 C) (Oral)   Resp 16   SpO2 97%   Visual Acuity Right Eye Distance:   Left Eye Distance:   Bilateral Distance:    Right Eye Near:   Left Eye Near:    Bilateral Near:     Physical Exam Vitals reviewed.  Constitutional:      General: She is not in acute distress.    Appearance: Normal appearance. She is ill-appearing.  HENT:     Head: Normocephalic and atraumatic.     Right Ear: Tympanic membrane, ear canal and external ear normal. No tenderness. No middle ear effusion. There is no impacted cerumen. Tympanic membrane is not perforated, erythematous, retracted or bulging.     Left Ear: Tympanic membrane, ear canal and external ear normal. No tenderness.  No middle ear effusion. There is no impacted cerumen. Tympanic membrane is not perforated, erythematous, retracted or bulging.     Nose: Nose normal. No congestion.     Mouth/Throat:     Mouth: Mucous membranes are moist.     Pharynx: Uvula midline. No oropharyngeal exudate or posterior oropharyngeal erythema.     Tonsils: No tonsillar exudate.  Eyes:     Extraocular Movements: Extraocular movements intact.     Pupils: Pupils are equal, round, and reactive to light.  Cardiovascular:     Rate and Rhythm: Normal rate and regular rhythm.     Heart sounds: Normal heart sounds.  Pulmonary:     Effort: Pulmonary effort is normal.     Breath sounds: Normal breath sounds. No decreased breath sounds, wheezing, rhonchi or rales.  Abdominal:     Palpations: Abdomen is soft.     Tenderness: There is no abdominal tenderness. There is no guarding or rebound.  Lymphadenopathy:     Cervical: No cervical adenopathy.     Right cervical: No superficial, deep or posterior cervical adenopathy.    Left cervical: No superficial, deep or posterior cervical adenopathy.  Skin:    Comments: No rash   Neurological:     General: No focal deficit present.     Mental Status: She is alert and oriented to person, place, and  time.  Psychiatric:  Mood and Affect: Mood normal.        Behavior: Behavior normal.        Thought Content: Thought content normal.        Judgment: Judgment normal.      UC Treatments / Results  Labs (all labs ordered are listed, but only abnormal results are displayed) Labs Reviewed - No data to display  EKG   Radiology No results found.  Procedures Procedures (including critical care time)  Medications Ordered in UC Medications - No data to display  Initial Impression / Assessment and Plan / UC Course  I have reviewed the triage vital signs and the nursing notes.  Pertinent labs & imaging results that were available during my care of the patient were reviewed by me and considered in my medical decision making (see chart for details).     Patient is a pleasant 43 y.o. female presenting with flulike symptoms following exposure to the flu. The patient is afebrile and nontachycardic.  Antipyretic has not been administered today.  IUD contraception.  We did not check a COVID or influenza test due to duration of symptoms.  We discussed that Tamiflu is not indicated after 5 days of symptoms, but due to severity of illness, the patient is still requesting it.  Sent.  Discussed side effect of nausea with vomiting.  Also sent Promethazine DM.  Continue Tylenol.  Good hydration, rest.  Return precautions as below.   Final Clinical Impressions(s) / UC Diagnoses   Final diagnoses:  Flu-like symptoms     Discharge Instructions      -You have influenza (the flu) -We are treating this with a medication called Tamiflu, which is an antiviral.  This will help reduce the severity and duration of the fluid.  -Promethazine DM cough syrup for congestion/cough. This could make you drowsy, so take at night before bed. -Continue drinking Gatorade and water! -Use tylenol and/or ibuprofen for fever reduction or bodyaches, if needed.  Follow the instructions on the bottle for  appropriate dosage.  You can take both Tylenol and ibuprofen; you can take them at the same time, or alternate every few hours (for example, Tylenol, then 4 hours later ibuprofen, then 4 hours later Tylenol, etc.) -Viruses like the flu typically last 5 to 7 days.  After 7 days, your symptoms should be improving rather than worsening.  If your symptoms improve, and then worsen again, this is when we worry about a sinus infection or a lung infection, and you should return for additional care.  If at any point you develop fevers that do not reduce with Tylenol, shortness of breath, a cough productive of dark or red sputum, or other symptoms that concern you, seek additional care.      ED Prescriptions     Medication Sig Dispense Auth. Provider   oseltamivir (TAMIFLU) 75 MG capsule Take 1 capsule (75 mg total) by mouth every 12 (twelve) hours. 10 capsule Mychal Decarlo E, PA-C   promethazine-dextromethorphan (PROMETHAZINE-DM) 6.25-15 MG/5ML syrup Take 5 mLs by mouth 4 (four) times daily as needed for cough. 118 mL Monchel Pollitt E, PA-C      PDMP not reviewed this encounter.     [1]  Social History Tobacco Use   Smoking status: Never   Smokeless tobacco: Never  Vaping Use   Vaping status: Never Used  Substance Use Topics   Alcohol use: No   Drug use: No     Arlyss Leita BRAVO, PA-C 03/07/24 1602  "

## 2024-03-07 NOTE — ED Triage Notes (Signed)
 Pt c/o fever, cough, congestion, chills, headache, and  body aches since Monday.
